# Patient Record
Sex: Female | Born: 2000 | Race: White | Hispanic: No | Marital: Single | Smoking: Never smoker
Health system: Southern US, Community
[De-identification: ages and names within clinical notes are randomized; demographics above are authoritative.]

## PROBLEM LIST (undated history)

## (undated) DIAGNOSIS — J45909 Unspecified asthma, uncomplicated: Secondary | ICD-10-CM

---

## 2018-07-04 ENCOUNTER — Encounter (HOSPITAL_COMMUNITY): Payer: Self-pay | Admitting: *Deleted

## 2018-07-04 ENCOUNTER — Emergency Department (HOSPITAL_COMMUNITY)
Admission: EM | Admit: 2018-07-04 | Discharge: 2018-07-04 | Disposition: A | Discharge status: Home / Self Care | Payer: BLUE CROSS/BLUE SHIELD | Attending: Emergency Medicine | Admitting: Emergency Medicine

## 2018-07-04 ENCOUNTER — Other Ambulatory Visit: Payer: Self-pay

## 2018-07-04 ENCOUNTER — Emergency Department (HOSPITAL_COMMUNITY): Payer: BLUE CROSS/BLUE SHIELD

## 2018-07-04 DIAGNOSIS — R079 Chest pain, unspecified: Secondary | ICD-10-CM | POA: Insufficient documentation

## 2018-07-04 DIAGNOSIS — J45909 Unspecified asthma, uncomplicated: Secondary | ICD-10-CM | POA: Diagnosis not present

## 2018-07-04 HISTORY — DX: Unspecified asthma, uncomplicated: J45.909

## 2018-07-04 LAB — BASIC METABOLIC PANEL
Anion gap: 9 (ref 5–15)
BUN: 7 mg/dL (ref 6–20)
CHLORIDE: 106 mmol/L (ref 98–111)
CO2: 24 mmol/L (ref 22–32)
CREATININE: 0.83 mg/dL (ref 0.44–1.00)
Calcium: 9.3 mg/dL (ref 8.9–10.3)
Glucose, Bld: 120 mg/dL — ABNORMAL HIGH (ref 70–99)
Potassium: 3.3 mmol/L — ABNORMAL LOW (ref 3.5–5.1)
SODIUM: 139 mmol/L (ref 135–145)

## 2018-07-04 LAB — CBC
HCT: 42.9 % (ref 36.0–46.0)
Hemoglobin: 14.1 g/dL (ref 12.0–15.0)
MCH: 28.5 pg (ref 26.0–34.0)
MCHC: 32.9 g/dL (ref 30.0–36.0)
MCV: 86.7 fL (ref 78.0–100.0)
PLATELETS: 253 10*3/uL (ref 150–400)
RBC: 4.95 MIL/uL (ref 3.87–5.11)
RDW: 12.2 % (ref 11.5–15.5)
WBC: 6.8 10*3/uL (ref 4.0–10.5)

## 2018-07-04 LAB — I-STAT TROPONIN, ED
TROPONIN I, POC: 0 ng/mL (ref 0.00–0.08)
TROPONIN I, POC: 0.01 ng/mL (ref 0.00–0.08)

## 2018-07-04 LAB — HEPATIC FUNCTION PANEL
ALK PHOS: 48 U/L (ref 38–126)
ALT: 16 U/L (ref 0–44)
AST: 25 U/L (ref 15–41)
Albumin: 4.2 g/dL (ref 3.5–5.0)
BILIRUBIN DIRECT: 0.1 mg/dL (ref 0.0–0.2)
BILIRUBIN TOTAL: 0.5 mg/dL (ref 0.3–1.2)
Indirect Bilirubin: 0.4 mg/dL (ref 0.3–0.9)
Total Protein: 7.2 g/dL (ref 6.5–8.1)

## 2018-07-04 LAB — I-STAT BETA HCG BLOOD, ED (MC, WL, AP ONLY)

## 2018-07-04 LAB — D-DIMER, QUANTITATIVE: D-Dimer, Quant: <0.27 ug/mL-FEU (ref 0.00–0.50)

## 2018-07-04 LAB — LIPASE, BLOOD: LIPASE: 37 U/L (ref 11–51)

## 2018-07-04 NOTE — Discharge Instructions (Signed)
Your work-up today did not show a worrisome cause of your chest pain.  We suspect is muscular skeletal.  The blood test to look for heart injury heart strain or heart attack was negative twice.  The blood clot test was negative.  Your other work-up was reassuring.  Please rest and follow-up with your primary doctor in several days.  If any symptoms change or worsen, please return to the nearest emergency department.

## 2018-07-04 NOTE — ED Notes (Signed)
Pt to xray from triage, will go to room once complete

## 2018-07-04 NOTE — ED Provider Notes (Signed)
[facility] EMERGENCY DEPARTMENT Provider Note   CSN: [id number] Arrival date & time: 07/04/18  1324     History   Chief Complaint Chief Complaint  Patient presents with  . Chest Pain    HPI Andrea Ballard is a 17 y.o. female.  The history is provided by the patient. No language interpreter was used.  Chest Pain   This is a new problem. The current episode started 1 to 2 hours ago. The problem occurs constantly. The problem has been gradually improving. The pain is present in the substernal region. The pain is at a severity of 6/10. The pain is moderate. The quality of the pain is described as sharp. The pain does not radiate. Pertinent negatives include no back pain, no claudication, no cough, no diaphoresis, no dizziness, no exertional chest pressure, no fever, no headaches, no hemoptysis, no irregular heartbeat, no leg pain, no lower extremity edema, no malaise/fatigue, no nausea, no near-syncope, no palpitations, no shortness of breath, no sputum production, no vomiting and no weakness. Risk factors include oral contraceptive use.  Pertinent negatives for past medical history include no CAD.    Past Medical History:  Diagnosis Date  . Asthma     There are no active problems to display for this patient.   History reviewed. No pertinent surgical history.   OB History   None      Home Medications    Prior to Admission medications   Not on File    Family History History reviewed. No pertinent family history.  Social History Social History   Tobacco Use  . Smoking status: Never Smoker  . Smokeless tobacco: Never Used  Substance Use Topics  . Alcohol use: Not on file  . Drug use: Not on file     Allergies   Patient has no known allergies.   Review of Systems Review of Systems  Constitutional: Negative for chills, diaphoresis, fatigue, fever and malaise/fatigue.  HENT: Negative for congestion.   Respiratory: Negative for cough,  hemoptysis, sputum production, chest tightness, shortness of breath, wheezing and stridor.   Cardiovascular: Positive for chest pain. Negative for palpitations, claudication, leg swelling and near-syncope.  Gastrointestinal: Negative for constipation, diarrhea, nausea and vomiting.  Genitourinary: Negative for dysuria.  Musculoskeletal: Negative for back pain, neck pain and neck stiffness.  Skin: Negative for rash and wound.  Neurological: Negative for dizziness, weakness, light-headedness and headaches.  Psychiatric/Behavioral: Negative for agitation.  All other systems reviewed and are negative.    Physical Exam Updated Vital Signs BP 124/77 (BP Location: Right Arm)   Pulse (!) 103   Temp 98 F (36.7 C) (Oral)   Resp 20   LMP 07/04/2017   SpO2 100%   Physical Exam  Constitutional: She is oriented to person, place, and time. She appears well-developed and well-nourished.  Non-toxic appearance. She does not appear ill. No distress.  HENT:  Head: Normocephalic and atraumatic.  Mouth/Throat: Oropharynx is clear and moist.  Eyes: Pupils are equal, round, and reactive to light. Conjunctivae and EOM are normal.  Neck: Normal range of motion. Neck supple.  Cardiovascular: Normal rate, regular rhythm and normal pulses.  No murmur heard. Pulmonary/Chest: Effort normal and breath sounds normal. No stridor. No respiratory distress. She has no wheezes. She has no rhonchi. She has no rales. She exhibits no tenderness.  Abdominal: Soft. There is no tenderness.  Musculoskeletal: She exhibits no edema or tenderness.  Neurological: She is alert and oriented to person, place, and time.  No sensory deficit. She exhibits normal muscle tone.  Skin: Skin is warm and dry. Capillary refill takes less than 2 seconds. No rash noted. She is not diaphoretic. No erythema.  Psychiatric: She has a normal mood and affect.  Nursing note and vitals reviewed.    ED Treatments / Results  Labs (all labs  ordered are listed, but only abnormal results are displayed) Labs Reviewed  BASIC METABOLIC PANEL - Abnormal; Notable for the following components:      Result Value   Potassium 3.3 (*)    Glucose, Bld 120 (*)    All other components within normal limits  CBC  D-DIMER, QUANTITATIVE (NOT AT [facility])  HEPATIC FUNCTION PANEL  LIPASE, BLOOD  I-STAT TROPONIN, ED  I-STAT BETA HCG BLOOD, ED ([facility], [facility], [facility] ONLY)  I-STAT TROPONIN, ED    EKG EKG Interpretation  Date/Time:  Friday July 04 2018 08:19:50 EDT Ventricular Rate:  102 PR Interval:  100 QRS Duration: 88 QT Interval:  346 QTC Calculation: 450 R Axis:   86 Text Interpretation:  Sinus tachycardia with short PR ST & T wave abnormality, consider inferior ischemia Abnormal ECG No prior ECG for comparison.  Possible S1Q3T3.  No STEMI Confirmed by Theda Belfast ([id number]) on 07/04/2018 8:42:32 AM   Radiology Dg Chest 2 View  Result Date: 07/04/2018 CLINICAL DATA:  Central chest pain while driving. EXAM: CHEST - 2 VIEW COMPARISON:  None. FINDINGS: Heart size is normal. Mediastinal shadows are normal. The lungs are clear. No bronchial thickening. No infiltrate, mass, effusion or collapse. Pulmonary vascularity is normal. Minimal spinal curvature. IMPRESSION: Normal chest, with the exception of minimal spinal curvature. Electronically Signed   By: Paulina Fusi M.D.   On: 07/04/2018 09:01    Procedures Procedures (including critical care time)  Medications Ordered in ED Medications - No data to display   Initial Impression / Assessment and Plan / ED Course  I have reviewed the triage vital signs and the nursing notes.  Pertinent labs & imaging results that were available during my care of the patient were reviewed by me and considered in my medical decision making (see chart for details).     Andrea Ballard is a 17 y.o. female with a past medical history of asthma who presents with chest pain.  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  She describes it as a sharp central chest pain that does not radiate.  She denied diaphoresis, nausea, vomiting, or shortness of breath.  She denied being exertional or pleuritic.  She does take birth control pills.  She denies recent leg pain or leg swelling.  No history of DVT or PE.  Patient does not smoke.  Patient has no trauma.  Patient has no fevers, chills, congestion, or cough.  She denies other complaints.  She describes the pain is up to a 6 out of 10 in severity and it waxes and wanes.  She is currently at a 2 out of 10 in severity.  Next  On exam, chest is nontender.  Lungs are clear.  No murmur was appreciated.  Back was nontender.  No CVA tenderness.  Abdomen was nontender.  Patient had symmetric pulses in upper lower extremity's.  No leg swelling or leg tenderness appreciated.  Next  EKG showed T wave inversions in multiple leads.  No prior ECG for comparison.  No STEMI.  Due to patient's birth  control and tachycardia on arrival, patient will have a d-dimer to rule out PE as cause of symptoms.  Patient will have chest x-ray and other lab testing.  She will have a delta troponin.  Anticipate symptoms due to muscular skeletal etiology if work-up is reassuring.  12:04 PM Diagnostic work-up was reassuring.  Troponin was negative x2.  D-dimer was undetectable.  Other laboratory testing showed very mild hypokalemia but was otherwise unremarkable.  Chest x-ray did not show pneumonia pneumothorax or other bony injury.  Patient reports her chest pain is complete resolved while in the ED.  Suspect muscular skeletal discomfort given her dancing.  Patient will follow-up with her PCP and understood return precautions.  Patient was discharged in good condition with her family.   Final Clinical Impressions(s) / ED Diagnoses   Final diagnoses:  Nonspecific chest pain    ED Discharge Orders    None       Clinical Impression: 1. Nonspecific chest pain     Disposition: Discharge  Condition: Good  I have discussed the results, Dx and Tx plan with the pt(& family if present). He/she/they expressed understanding and agree(s) with the plan. Discharge instructions discussed at great length. Strict return precautions discussed and pt &/or family have verbalized understanding of the instructions. No further questions at time of discharge.    New Prescriptions   No medications on file    [facility] [address] [id number] [place] [id number] [phone]       Tegeler, Canary Brimhristopher J, MD 07/04/18 [phone]

## 2018-07-04 NOTE — ED Notes (Signed)
Patient verbalizes understanding of discharge instructions. Opportunity for questioning and answers were provided. Armband removed by staff, pt discharged from ED.  

## 2018-07-04 NOTE — ED Triage Notes (Signed)
Pt in c/o central chest pain that started while driving to class today, denies shortness of breath or n/v, pain is unchanged by movement or taking a deep breath

## 2019-10-21 IMAGING — DX DG CHEST 2V
2 series · 2 of 2 positions shown · non-contrast
Comparison: None.

CLINICAL DATA: Central chest pain while driving.

EXAM:
CHEST - 2 VIEW

[chest pa]
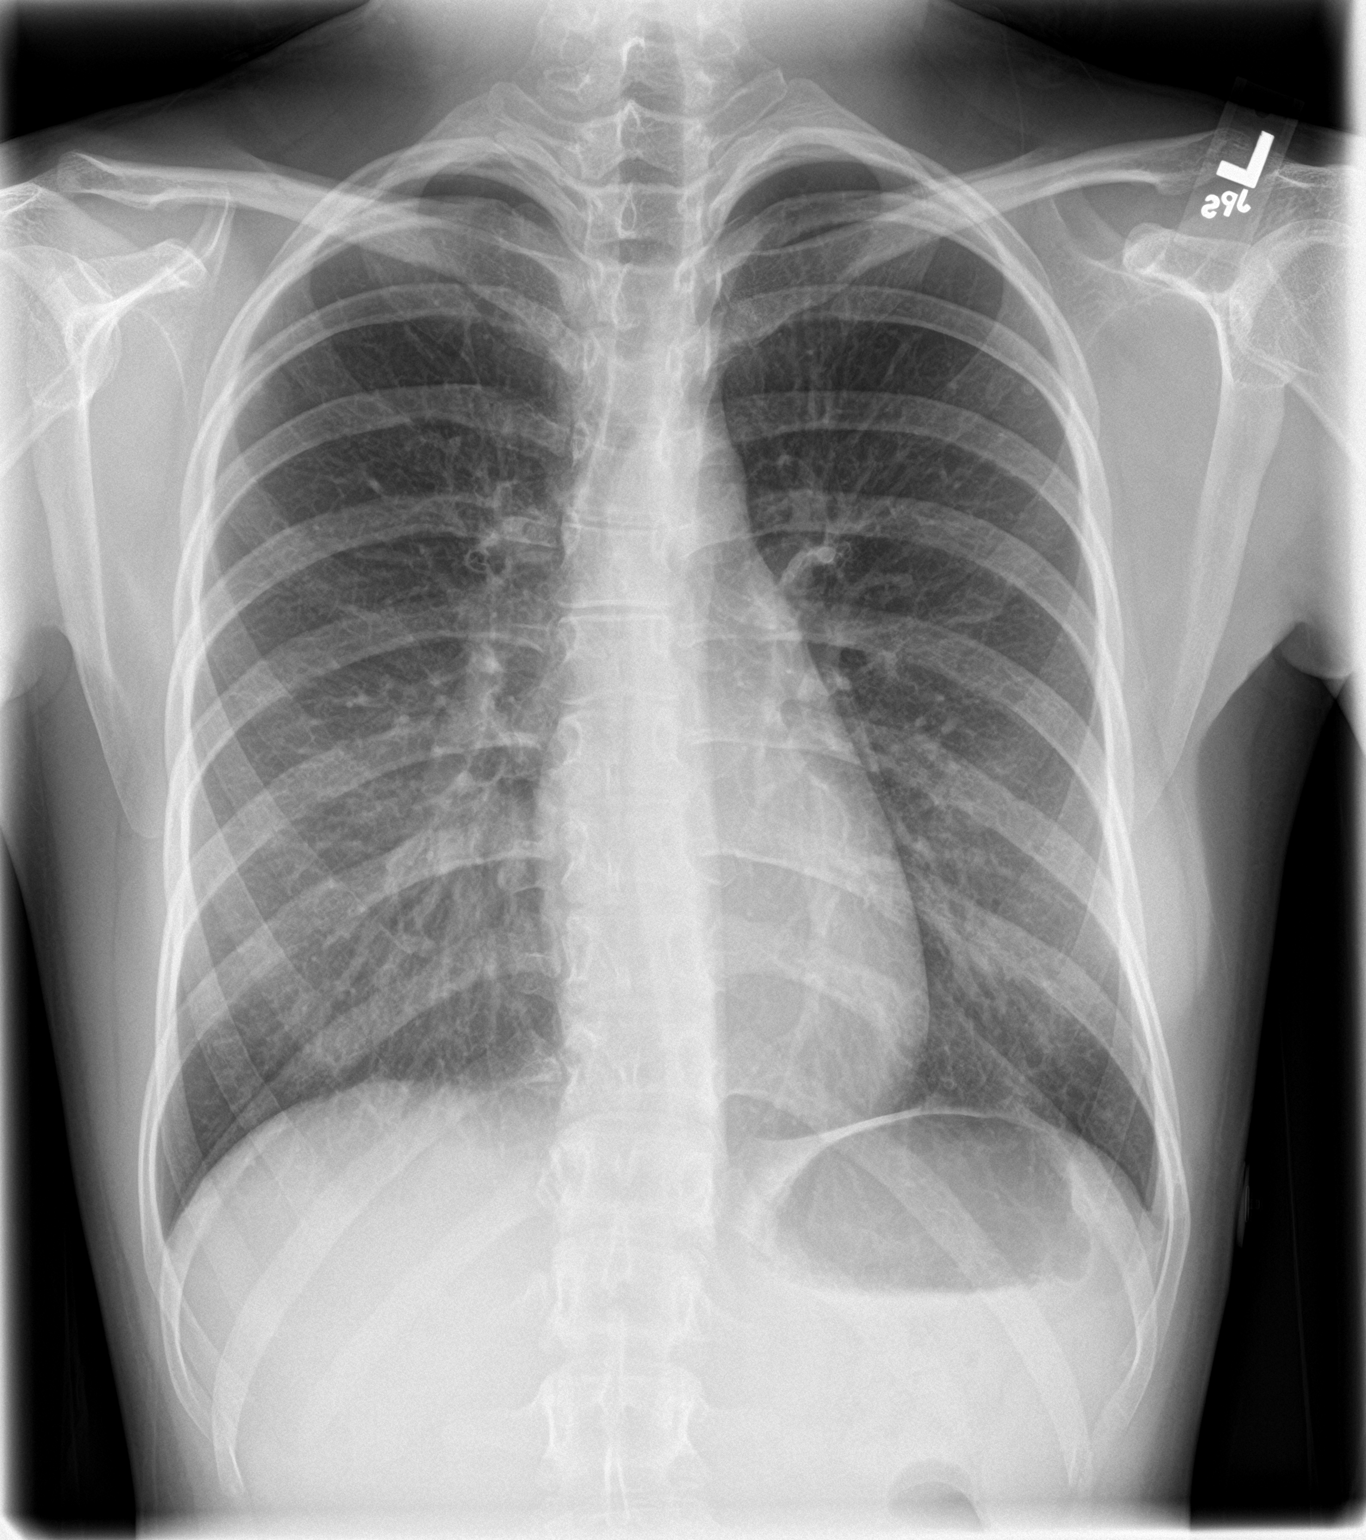

[chest lat]
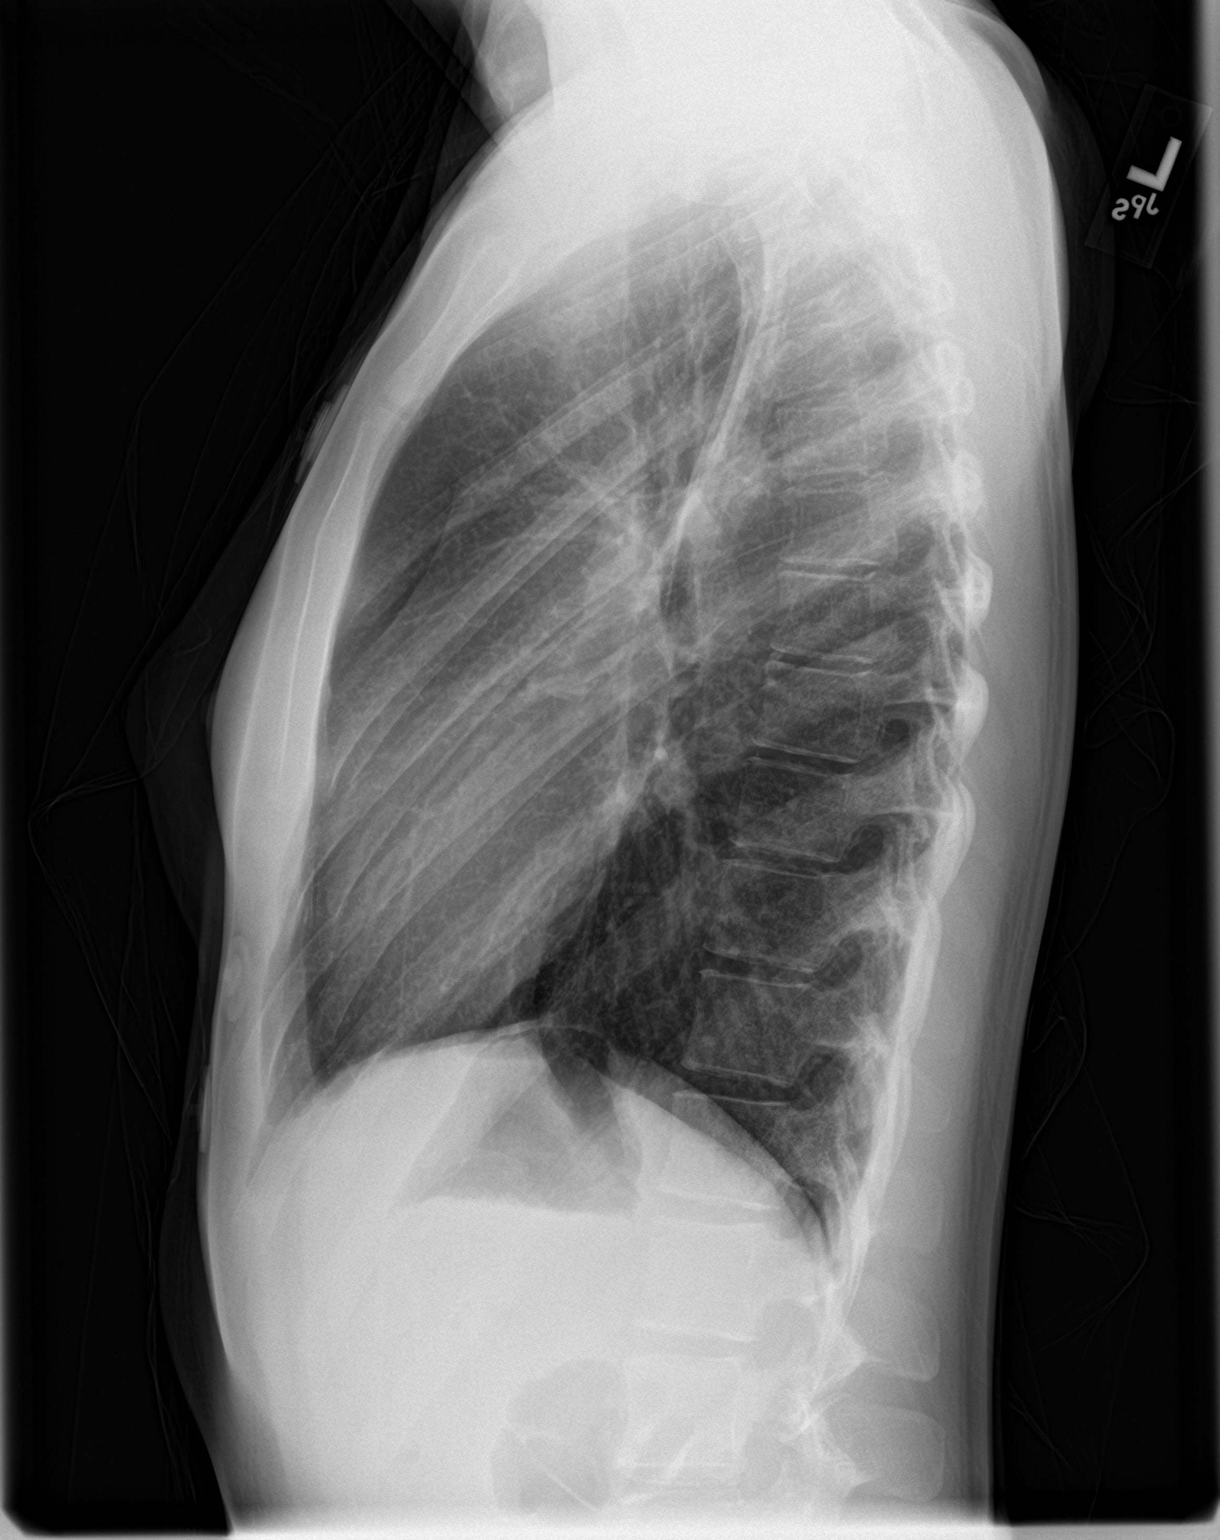

[2 of 2 positions shown; findings below may reference images not displayed]

FINDINGS: Heart size is normal. Mediastinal shadows are normal. The lungs are
clear. No bronchial thickening. No infiltrate, mass, effusion or
collapse. Pulmonary vascularity is normal. Minimal spinal curvature.
IMPRESSION: Normal chest, with the exception of minimal spinal curvature.
